# Patient Record
Sex: Male | Born: 2000 | Race: White | Hispanic: No | Marital: Single | Smoking: Current every day smoker
Health system: Southern US, Community
[De-identification: ages and names within clinical notes are randomized; demographics above are authoritative.]

## PROBLEM LIST (undated history)

## (undated) DIAGNOSIS — F32A Depression, unspecified: Secondary | ICD-10-CM

## (undated) DIAGNOSIS — J45909 Unspecified asthma, uncomplicated: Secondary | ICD-10-CM

## (undated) DIAGNOSIS — F419 Anxiety disorder, unspecified: Secondary | ICD-10-CM

## (undated) DIAGNOSIS — F329 Major depressive disorder, single episode, unspecified: Secondary | ICD-10-CM

## (undated) DIAGNOSIS — R5383 Other fatigue: Secondary | ICD-10-CM

## (undated) HISTORY — DX: Depression, unspecified: F32.A

## (undated) HISTORY — DX: Major depressive disorder, single episode, unspecified: F32.9

## (undated) HISTORY — DX: Anxiety disorder, unspecified: F41.9

## (undated) HISTORY — DX: Other fatigue: R53.83

---
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
## 2020-09-30 ENCOUNTER — Encounter: Payer: Self-pay | Admitting: Psychiatry

## 2021-12-21 ENCOUNTER — Encounter: Payer: Self-pay | Admitting: Physical Therapy

## 2021-12-21 ENCOUNTER — Other Ambulatory Visit: Payer: Self-pay

## 2021-12-21 ENCOUNTER — Ambulatory Visit: Payer: Medicaid Other | Attending: Physician Assistant | Admitting: Physical Therapy

## 2021-12-21 DIAGNOSIS — M6281 Muscle weakness (generalized): Secondary | ICD-10-CM | POA: Insufficient documentation

## 2021-12-21 DIAGNOSIS — R279 Unspecified lack of coordination: Secondary | ICD-10-CM | POA: Diagnosis present

## 2021-12-21 NOTE — Patient Instructions (Addendum)
About Constipation  Constipation Overview Constipation is the most common gastrointestinal complaint -- about 4 million Americans experience constipation and make 2.5 million physician visits a year to get help for the problem.  Constipation can occur when the colon absorbs too much water, the colons muscle contraction is slow or sluggish, and/or there is delayed transit time through the colon.  The result is stool that is hard and dry.  Indicators of constipation include straining during bowel movements greater than 25% of the time, having fewer than three bowel movements per week, and/or the feeling of incomplete evacuation.  There are established guidelines ([place] ) for defining constipation. A person needs to have two or more of the following symptoms for at least 12 weeks (not necessarily consecutive) in the preceding 12 months: Straining in  greater than 25% of bowel movements Lumpy or hard stools in greater than 25% of bowel movements Sensation of incomplete emptying in greater than 25% of bowel movements Sensation of anorectal obstruction/blockade in greater than 25% of bowel movements Manual maneuvers to help empty greater than 25% of bowel movements (e.g., digital evacuation, support of the pelvic floor)  Less than  3 bowel movements/week Loose stools are not present, and criteria for irritable bowel syndrome are insufficient Common Causes of Constipation lack of fiber in your diet lack of physical activity medications, including iron and calcium supplements  dairy intake dehydration abuse of laxatives travel Irritable Bowel Syndrome pregnancy luteal phase of menstruation (after ovulation and before menses) colorectal problems intestinal dysfunction  Treating Constipation  There are several ways of treating constipation, including changes to diet and exercise, use of laxatives, adjustments to the pelvic floor, and scheduled toileting.  These treatments include: increasing  fiber and fluids in the diet  increasing physical activity learning muscle coordination  learning proper toileting techniques and toileting modifications  designing and sticking  to a toileting schedule             Types of Fiber  There are two main types of fiber:  insoluble and soluble.  Both of these types can prevent and relieve constipation and diarrhea, although some people find one or the other to be more easily digested.  This handout details information about both types of fiber.  Insoluble Fiber       Functions of Insoluble Fiber moves bulk through the intestines  controls and balances the pH (acidity) in the intestines       Benefits of Insoluble Fiber promotes regular bowel movement and prevents constipation  removes fecal waste through colon in less time  keeps an optimal pH in intestines to prevent microbes from producing cancer substances, therefore preventing colon cancer        Food Sources of Insoluble Fiber whole-wheat products  wheat bran millers bran corn bran  flax seed or other seeds vegetables such as green beans, broccoli, cauliflower and potato skins  fruit skins and root vegetable skins  popcorn brown rice  Soluble Fiber       Functions of Soluble Fiber  holds water in the colon to bulk and soften the stool prolongs stomach emptying time so that sugar is released and absorbed more slowly        Benefits of Soluble Fiber lowers total cholesterol and LDL cholesterol (the bad cholesterol) therefore reducing the risk of heart disease  regulates blood sugar for people with diabetes       Food Sources of Soluble Fiber oat/oat bran dried beans and peas  nuts  barley  flax seed or other seeds fruits such as oranges, pears, peaches, and apples  vegetables such as carrots  psyllium husk  prunes

## 2021-12-22 NOTE — Therapy (Addendum)
[facility] [place], [place], [address] Phone: [phone]   Fax:  [phone]  Physical Therapy Evaluation  Patient Details  Name: Keith Mckenzie MRN: [id number] Date of Birth: Jan 07, 2001 Referring Provider (PT): Ramond Dial, [place]   Encounter Date: 12/21/2021   PT End of Session - 12/21/21 1659     Visit Number 1    Date for PT Re-Evaluation 02/18/22    Authorization Type [employer]    PT Start Time 1630   pt arrival time   PT Stop Time 1700    PT Time Calculation (min) 30 min    Activity Tolerance Patient tolerated treatment well    Behavior During Therapy [facility] for tasks assessed/performed             Past Medical History:  Diagnosis Date   Anxiety    Asthma    Depression    Fatigue     History reviewed. No pertinent surgical history.  There were no vitals filed for this visit.    Subjective Assessment - 12/21/21 1633     Subjective Pt reports 1.5 year h/o lower abdominal cramping after eating almost all the time, is taking vitamin C which reports helps pain. Pt reports when he tries something new it will help for aehile then return. Pt reports he does need to strain to push stool out, usually 2x a day with medication if needed and without 1x per week and small amounts. Pt denies urination deficits. Has tried multiple different diets but doesn't help. Pt reports without medication he doesn't feel like he can fully evacuate and needs to strain. Reports type 2 if no medication taken and can be type 5 with laxative, or type 4 with fiber supplement, pt reported he has not tried stool softeners.    Pertinent History n/a    How long can you sit comfortably? no limits    How long can you stand comfortably? no limits    How long can you walk comfortably? no limits    Patient Stated Goals to be more regular with BMs    Currently in Pain? No/denies                             Objective measurements completed on examination: See above findings.                PT Education - 12/21/21 1659     Education Details Pt educated on exam findings, breathing mechanics, constipation, voiding mechanics, and fiber types.    Person(s) Educated Patient    Methods Explanation;Demonstration;Tactile cues;Verbal cues;Handout    Comprehension Returned demonstration;Verbalized understanding              PT Short Term Goals - 12/22/21 0915       PT SHORT TERM GOAL #1   Title Pt to be I with HEP    Time 4    Period Weeks    Status New    Target Date 01/19/22      PT SHORT TERM GOAL #2   Title pt to be able to recall proper techniques for voiding and breathing mechanics and abdominal massage    Time 4    Period Weeks    Status New    Target Date 01/19/22      PT SHORT TERM GOAL #3   Title pt to report at least types 3-4 on [place] stool  scale for 75% of BMs for improved stool regularity and normal consistency    Time 4    Period Weeks    Status New    Target Date 01/19/22               PT Long Term Goals - 12/22/21 0917       PT LONG TERM GOAL #1   Title Pt to be I withadvanced HEP    Time 2    Period Months    Status New    Target Date 02/18/22      PT LONG TERM GOAL #2   Title pt to report at least one BM every other day for improved bowel health and regularity to decrease constipation symptoms.    Time 2    Period Months    Status New    Target Date 02/18/22                    Plan - 12/21/21 1700     Clinical Impression Statement Pt is 21 yo male presenting with 1.5 year h/o constipation with limited effect with diet modifications or medications. Pt reports he has tried a lot of different things and nothing has helped. Pt reports he does need to strain frequently to pass stool, without medication does feel he can fully evacuate, and will have lower abdominal cramping after eating meals  intermittently and sometimes pain with passing stool if harder type. Pt usually has type 1 without medication, type 4 with fiber supplements, and type 5 with laxatives. Pt reports he usually has 2 BMs per day with medication and sometimes one small BM per week without them. Pt reports he doesn't consistently use fiber supplements as the type he had made his stomach hurt so her stopped taking them. Pt educated on voiding and breathing mechanics, abdominal massage, fiber types, and handouts given. Pt denied questions and reports he is motivated to attempt these to see if anything helps. Pt was found to have [facility] spine and hip mobility and strength without pain and fascil restrictions in all directions and quadrants in abdomen. Pt tolerated session well and would benefit from additional PT to continue to address deficits and promote healthy bowel health. Pt limited in ability to discuss and complete pelvic floor assessment due to arriving 15 mins late for evaluation but can assess next session.    Personal Factors and Comorbidities Time since onset of injury/illness/exacerbation    Examination-Activity Limitations Toileting    Examination-Participation Restrictions Community Activity    Stability/Clinical Decision Making Stable/Uncomplicated    Clinical Decision Making Low    Rehab Potential Good    PT Frequency 1x / week    PT Duration Other (comment)   10 weeks   PT Treatment/Interventions ADLs/Self Care Home Management;Aquatic Therapy;Therapeutic activities;Therapeutic exercise;Neuromuscular re-education;Manual techniques;Patient/family education;Taping;Manual lymph drainage;Scar mobilization;Passive range of motion;Energy conservation;Dry needling    PT Next Visit Plan go over all handouts possibly internal    Consulted and Agree with Plan of Care Patient             Patient will benefit from skilled therapeutic intervention in order to improve the following deficits and impairments:  Decreased  coordination, Decreased endurance, Pain, Increased fascial restricitons, Improper body mechanics, Decreased mobility  Visit Diagnosis: Muscle weakness (generalized) - Plan: PT plan of care cert/re-cert  Lack of coordination - Plan: PT plan of care cert/re-cert     Problem List Patient Active Problem List   Diagnosis Date Noted  Moderate recurrent major depression ([place]) 11/13/2018   Panic disorder 11/13/2018   Social anxiety disorder 11/13/2018   Cannabis use disorder, moderate, dependence ([place]) 11/13/2018   Non-rapid eye movement sleep arousal disorder, sleep walking type 11/13/2018   Stacy Gardner, PT, DPT 01/11/239:19 AM    PHYSICAL THERAPY DISCHARGE SUMMARY  Visits from Start of Care: 1  Current functional level related to goals / functional outcomes: Unable to formally reassess as pt called to be discharged from PT reporting he did not feel this was helping. Pt only seen for initial evaluation.    Remaining deficits: Unable to formally reassess, pt only seen for initial evaluation and requested to be Discharged.    Education / Equipment: HEP   Patient agrees to discharge. Patient goals were not met. Patient is being discharged due to the patient's request. Thank you for the referral.    [facility] @ [address] [place], [place], [address] Phone: [phone]   Fax:  [phone]  Name: THORN DEMAS MRN: [id number] Date of Birth: February 19, 2001

## 2022-01-13 ENCOUNTER — Ambulatory Visit: Payer: Medicaid Other | Attending: Physician Assistant | Admitting: Physical Therapy

## 2022-01-13 DIAGNOSIS — R279 Unspecified lack of coordination: Secondary | ICD-10-CM | POA: Insufficient documentation

## 2022-01-13 DIAGNOSIS — M6281 Muscle weakness (generalized): Secondary | ICD-10-CM | POA: Insufficient documentation

## 2022-01-27 ENCOUNTER — Encounter: Payer: Medicaid Other | Admitting: Physical Therapy

## 2022-02-10 ENCOUNTER — Encounter: Payer: Medicaid Other | Admitting: Physical Therapy

## 2022-02-24 ENCOUNTER — Encounter: Payer: Medicaid Other | Admitting: Physical Therapy
